# Patient Record
Sex: Male | Born: 1975 | Race: White | Hispanic: No | Marital: Married | Smoking: Current every day smoker
Health system: Southern US, Community
[De-identification: ages and names within clinical notes are randomized; demographics above are authoritative.]

---

## 2015-05-27 ENCOUNTER — Encounter: Payer: Self-pay | Admitting: Emergency Medicine

## 2015-05-27 ENCOUNTER — Emergency Department (INDEPENDENT_AMBULATORY_CARE_PROVIDER_SITE_OTHER): Payer: Managed Care, Other (non HMO)

## 2015-05-27 ENCOUNTER — Emergency Department
Admission: EM | Admit: 2015-05-27 | Discharge: 2015-05-27 | Disposition: A | Discharge status: Home / Self Care | Payer: Managed Care, Other (non HMO) | Source: Home / Self Care | Attending: Emergency Medicine | Admitting: Emergency Medicine

## 2015-05-27 DIAGNOSIS — M25522 Pain in left elbow: Secondary | ICD-10-CM

## 2015-05-27 DIAGNOSIS — S59902A Unspecified injury of left elbow, initial encounter: Secondary | ICD-10-CM

## 2015-05-27 MED ORDER — HYDROCODONE-ACETAMINOPHEN 5-325 MG PO TABS: 2.0000 | ORAL_TABLET | ORAL | Status: AC | PRN

## 2015-05-27 NOTE — Discharge Instructions (Signed)
Elbow Effusion °You have an elbow injury with an effusion. This means there is blood or other fluid in the elbow joint. Both fractures and sprains of the elbow cab cause an effusion with swelling and pain. X-rays often show this swelling around the joint, but they may not show a fracture. The treatment for elbow sprains and minor fractures is to reduce swelling and pain. It rests the joint until movement is painless. Repeating the x-ray study in 1-2 weeks may show a minor fracture of the radius bone that was not visible on the initial x-rays. °Most of the time a splint or sling is used for the first days or week after the injury. Apply ice packs to the elbow for 20-30 minutes every 2 hours for the next 2-3 days. Keep your elbow elevated above the level of your heart as much as possible until the pain and swelling are better. An elastic wrap may also be used to reduce swelling. Call your caregiver for follow-up care within one week.  °The major issue with this condition is loss of elbow motion. In general, your caregiver will start you on motion exercises and may have you follow-up with a physical or hand therapist. °SEEK MEDICAL CARE IF:  °· You develop a numb, cold, or pale forearm or hand. °Document Released: 10/29/2004 Document Revised: 12/14/2011 Document Reviewed: 03/19/2009 °[employer]® Patient Information ©2015 [employer]. This information is not intended to replace advice given to you by your health care provider. Make sure you discuss any questions you have with your health care provider. ° °

## 2015-05-27 NOTE — ED Notes (Signed)
Left elbow injury x 2 nights ago fell off a hover board onto left elbow, small lac to right thumb, hit back of head, denies nausea, vomiting, blurred vision, no LOC.

## 2015-05-27 NOTE — ED Provider Notes (Signed)
CSN: [id number]     Arrival date & time 05/27/15  1607 History   First MD Initiated Contact with Patient 05/27/15 1624     Chief Complaint  Patient presents with  . Elbow Injury   (Consider location/radiation/quality/duration/timing/severity/associated sxs/prior Treatment) Patient is a 39 y.o. male presenting with fall. The history is provided by the patient. No language interpreter was used.  Fall This is a new problem. The current episode started more than 2 days ago. The problem occurs constantly. The problem has been gradually worsening. Pertinent negatives include no headaches. Nothing aggravates the symptoms. Nothing relieves the symptoms. He has tried nothing for the symptoms. The treatment provided no relief.  Pt complains of swelling to left elbow.   History reviewed. No pertinent past medical history. History reviewed. No pertinent past surgical history. History reviewed. No pertinent family history. Social History  Substance Use Topics  . Smoking status: Current Every Day Smoker  . Smokeless tobacco: None  . Alcohol Use: Yes    Review of Systems  Musculoskeletal: Positive for myalgias and joint swelling.  Neurological: Negative for headaches.  All other systems reviewed and are negative.   Allergies  Review of patient's allergies indicates no known allergies.  Home Medications   Prior to Admission medications   Not on File   BP 141/88 mmHg  Pulse 93  Temp(Src) 98.5 F (36.9 C) (Oral)  Ht  (1.803 m)  Wt 236 lb (107.049 kg)  BMI 32.93 kg/m2  SpO2 97% Physical Exam  Constitutional: He is oriented to person, place, and time. He appears well-developed and well-nourished.  HENT:  Head: Normocephalic and atraumatic.  Right Ear: External ear normal.  Left Ear: External ear normal.  Eyes: Pupils are equal, round, and reactive to light.  Cardiovascular: Normal rate.   Pulmonary/Chest: Effort normal.  Abdominal: Soft.  Musculoskeletal: He exhibits  tenderness.  Neurological: He is alert and oriented to person, place, and time. He has normal reflexes.  Skin: Skin is warm.  Psychiatric: He has a normal mood and affect.  Nursing note and vitals reviewed.   ED Course  Procedures (including critical care time) Labs Review Labs Reviewed - No data to display  Imaging Review Dg Elbow Complete Left  05/27/2015   CLINICAL DATA:  Larey Seat riding a however board on Saturday injuring LEFT elbow, edema and bruising at forearm, posterior elbow pain to hand, intermittent tingling in fingers  EXAM: LEFT ELBOW - COMPLETE 3+ VIEW  COMPARISON:  None  FINDINGS: Large well-formed corticated ossicle at medial aspect of distal humerus consistent with a nonunited medial epicondyle ossification center, developmental variant.  Osseous mineralization normal.  Joint spaces preserved.  Diffuse soft tissue swelling.  No acute fracture, dislocation, or bone destruction.  No elbow joint effusion.  IMPRESSION: No acute osseous abnormalities.   Electronically Signed   By: Ulyses Southward M.D.   On: 05/27/2015 16:58     MDM   1. Elbow injury, left, initial encounter    Hydrocodone Sling Schedule to see Dr. Denyse Amass for evaluation I am suspicious for occult fracture.     Lonia Skinner [place], PA-C 05/27/15 1728

## 2015-05-28 NOTE — ED Notes (Signed)
Pt scheduled to f/u with Dr. Denyse Amass 05/29/15 . Pt notified of time and location.

## 2015-05-29 ENCOUNTER — Other Ambulatory Visit: Payer: Self-pay | Admitting: Family Medicine

## 2015-05-29 ENCOUNTER — Encounter: Payer: Self-pay | Admitting: Family Medicine

## 2015-05-29 ENCOUNTER — Ambulatory Visit (INDEPENDENT_AMBULATORY_CARE_PROVIDER_SITE_OTHER): Payer: Managed Care, Other (non HMO) | Admitting: Family Medicine

## 2015-05-29 ENCOUNTER — Ambulatory Visit (INDEPENDENT_AMBULATORY_CARE_PROVIDER_SITE_OTHER): Payer: Managed Care, Other (non HMO)

## 2015-05-29 VITALS — BP 134/95 | HR 86 | Wt 238.0 lb

## 2015-05-29 DIAGNOSIS — S59902A Unspecified injury of left elbow, initial encounter: Secondary | ICD-10-CM

## 2015-05-29 DIAGNOSIS — W19XXXD Unspecified fall, subsequent encounter: Secondary | ICD-10-CM

## 2015-05-29 DIAGNOSIS — M25522 Pain in left elbow: Secondary | ICD-10-CM

## 2015-05-30 DIAGNOSIS — S59902A Unspecified injury of left elbow, initial encounter: Secondary | ICD-10-CM | POA: Insufficient documentation

## 2015-05-30 NOTE — Progress Notes (Signed)
   Subjective:    I'm seeing this patient as a consultation for:  Trisha Mangle PA-C and Dr. Georgina Pillion  CC: Left elbow injury.   HPI: Patient was seen in urgent care on August 22 after falling and landing on his left elbow at home. Patient is right-hand dominant. He was seen in urgent care where he was found to have a significant amount of swelling. X-ray was performed which did not show any acute findings. 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 He was given a posterior arm splint and sling. The last 2 days he has removed the splint because he is unable to work with that and is no longer using the sling. He notes a significant amount of swelling and bruising especially along the ulnar aspect of the elbow near the medial epicondyle.  He notes that the swelling extends down almost to his hand. The pain has improved however he notes continued pain especially with extension of the elbow. He has tried over-the-counter medicines and has not taken any of that prescription pain medications. He continues to work an Personnel officer.  Past medical history, Surgical history, Family history not pertinant except as noted below, Social history, Allergies, and medications have been entered into the medical record, reviewed, and no changes needed.   Review of Systems: No headache, visual changes, nausea, vomiting, diarrhea, constipation, dizziness, abdominal pain, skin rash, fevers, chills, night sweats, weight loss, swollen lymph nodes, body aches, joint swelling, muscle aches, chest pain, shortness of breath, mood changes, visual or auditory hallucinations.   Objective:    Filed Vitals:   05/29/15 1657  BP: 134/95  Pulse: 86   General: Well Developed, well nourished, and in no acute distress.  Neuro/Psych: Alert and oriented x3, extra-ocular muscles intact, able to move all 4 extremities, sensation grossly intact. Skin: Warm and dry, no rashes noted.  Respiratory:  Not using accessory muscles, speaking in full sentences, trachea midline.  Cardiovascular: Pulses palpable, no extremity edema. Abdomen: Does not appear distended. MSK: Left elbow significant ecchymosis and swelling starting at the medial upper condyle extending down into the wrist. He has mild tenderness at the medial upper condyle. Range of motion is limited by 5 of full extension. Normal pronation and supination. Normal flexion. Patient has no tenderness with UCL stress but does have some amount of laxity compared to the contralateral right side.  Pulses capillary refill and sensation are intact distally. Strength is intact to the elbow.   No results found for this or any previous visit (from the past 24 hour(s)). 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 Likely developmental variant.There is no evidence of fracture, dislocation, or joint effusion. There is no evidence of arthropathy or other focal bone abnormality. Soft tissues are unremarkable.  IMPRESSION: 1. No acute osseous abnormalities.   Electronically Signed   By: Signa Kell M.D.   On: 05/29/2015 16:50    Impression and Recommendations:   This case required medical decision making of moderate complexity.

## 2015-05-30 NOTE — Assessment & Plan Note (Signed)
Left elbow injury. Patient does not appear to have a fracture that we can see. 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 This is likely acting similarly to an ulnar collateral ligament tear. Additionally he may have an ulnar collateral ligament tear. Either way he seems to be improving and doing well. Will continue to have relative rest of the elbow and will return to clinic in 1-2 weeks for recheck and reevaluation of the elbow.

## 2015-05-30 NOTE — Progress Notes (Signed)
Quick Note:  Normal, no changes. ______ 

## 2016-10-05 IMAGING — CR DG ELBOW COMPLETE 3+V*L*
4 series · 4 of 4 positions shown · non-contrast
Comparison: None

CLINICAL DATA: Fell riding a however board on [REDACTED] injuring
LEFT elbow, edema and bruising at forearm, posterior elbow pain to
hand, intermittent tingling in fingers

EXAM:
LEFT ELBOW - COMPLETE 3+ VIEW

[elbow ap]
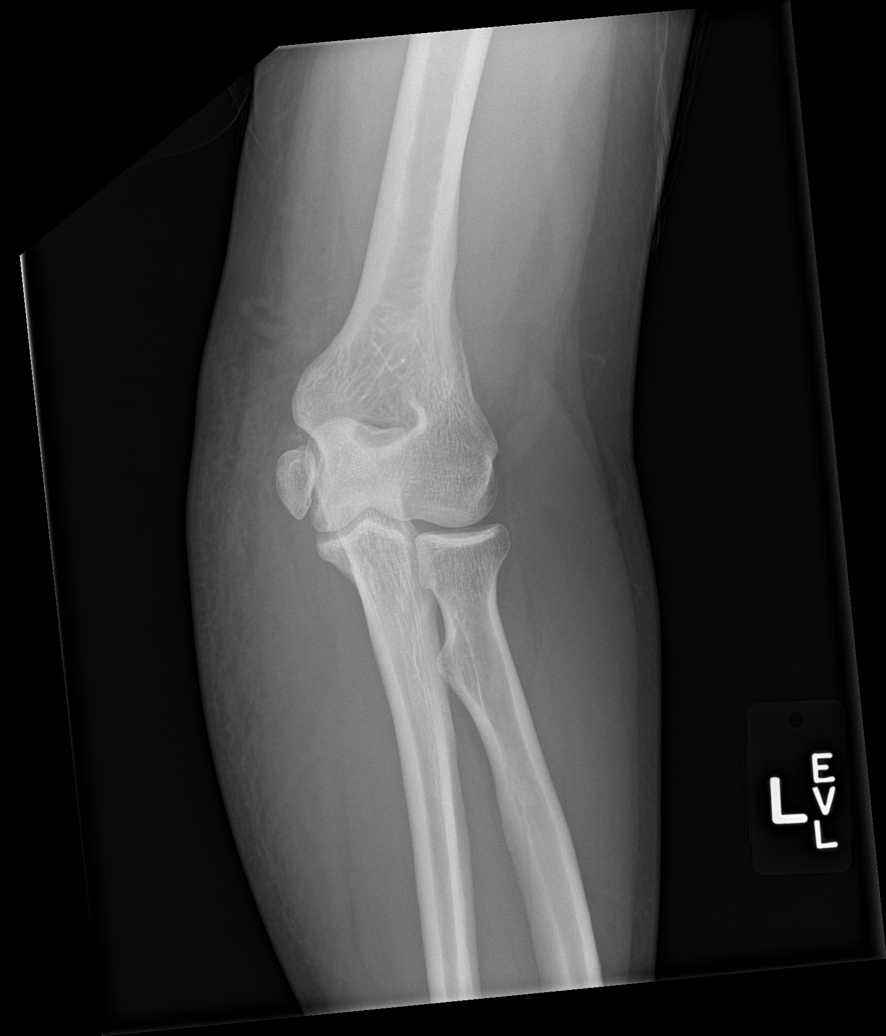

[elbow obl (1 of 2)]
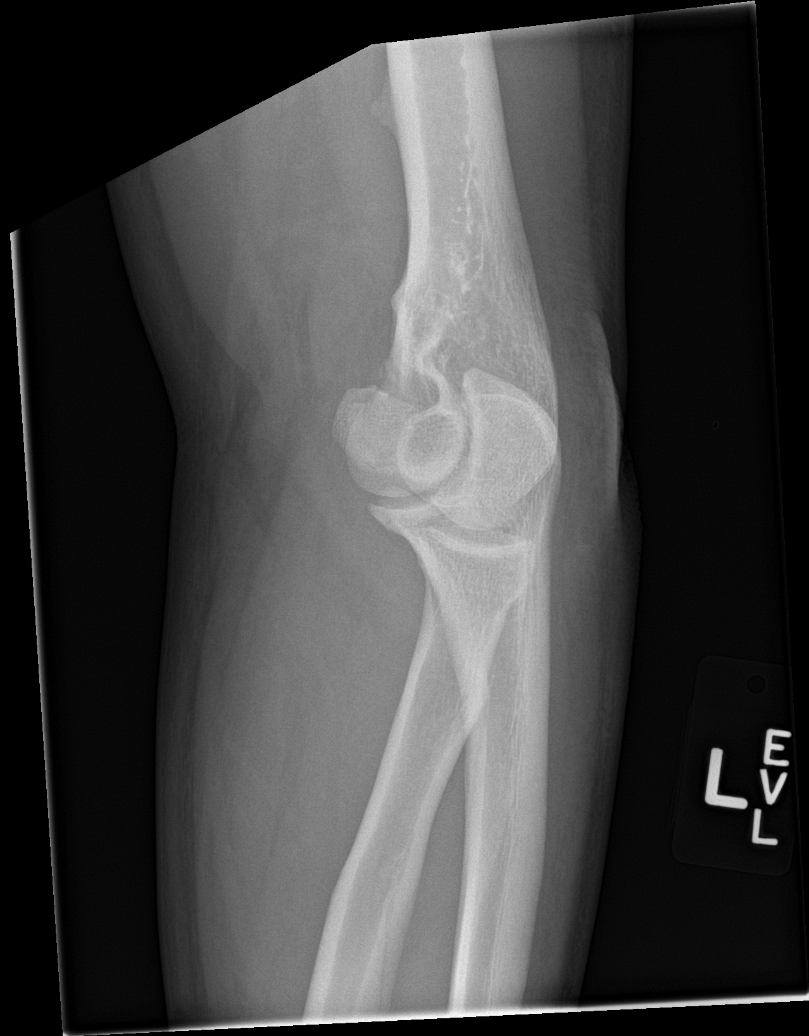

[elbow lat]
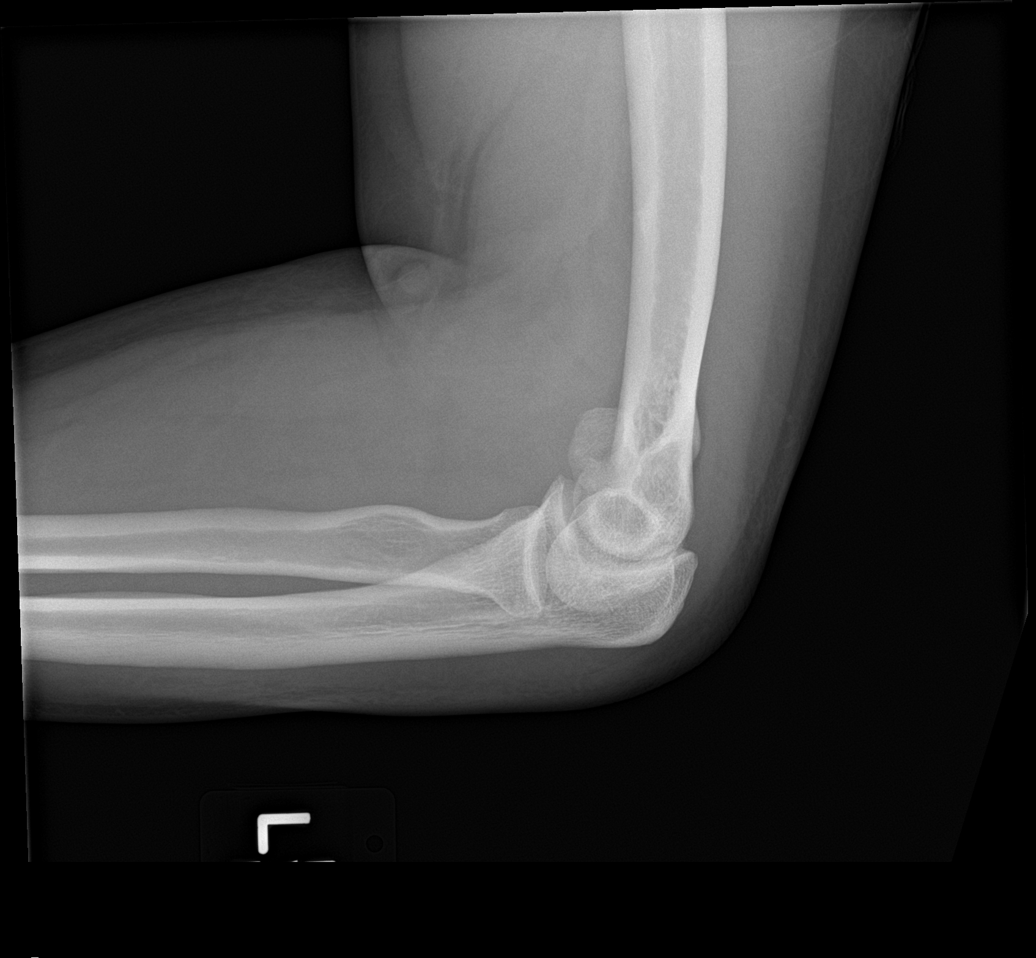

[elbow obl (2 of 2)]
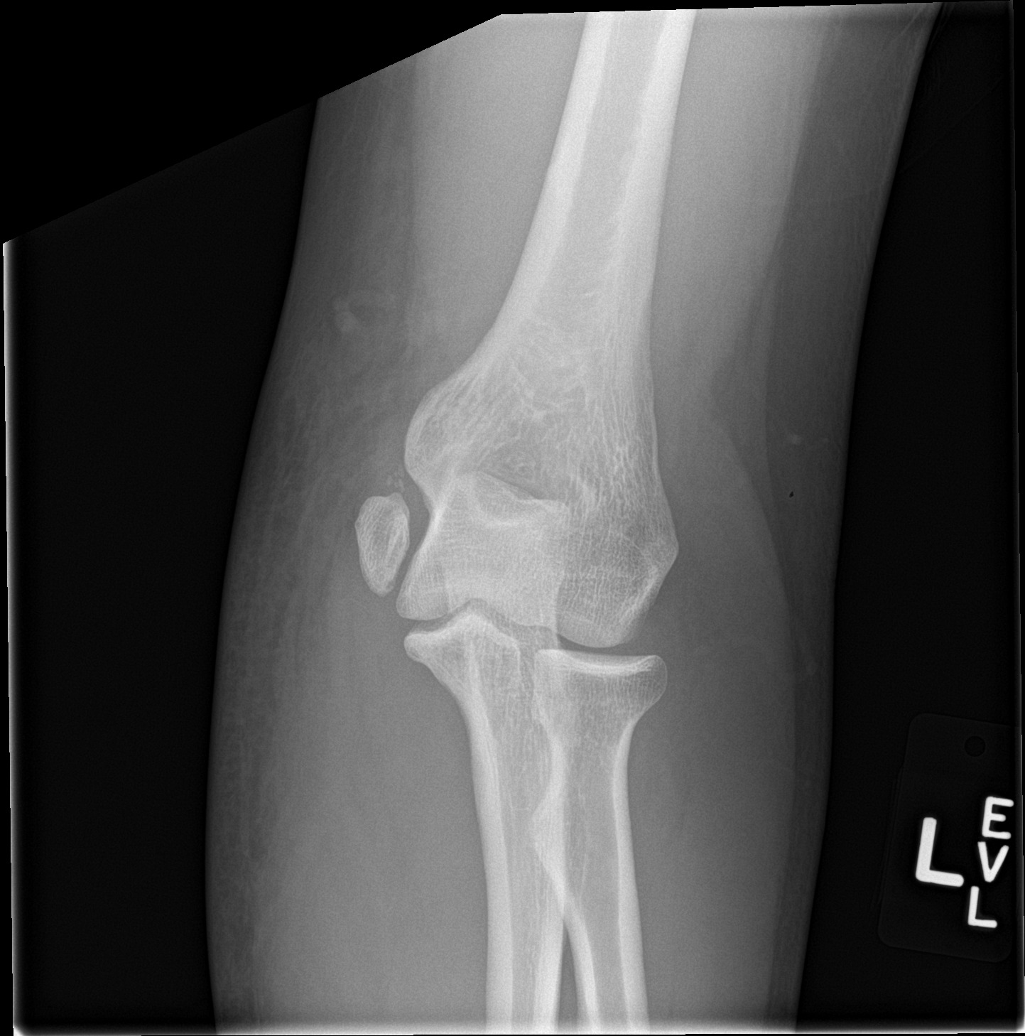

[4 of 4 positions shown; findings below may reference images not displayed]

FINDINGS: Large well-formed corticated ossicle at medial aspect of distal
humerus consistent with a nonunited medial epicondyle ossification
center, developmental variant.

Osseous mineralization normal.

Joint spaces preserved.

Diffuse soft tissue swelling.

No acute fracture, dislocation, or bone destruction.

No elbow joint effusion.
IMPRESSION: No acute osseous abnormalities.
